# Patient Record
Sex: Male | Born: 1980 | Race: White | Hispanic: No | Marital: Single | Smoking: Current every day smoker
Health system: Southern US, Community
[De-identification: ages and names within clinical notes are randomized; demographics above are authoritative.]

## PROBLEM LIST (undated history)

## (undated) DIAGNOSIS — F209 Schizophrenia, unspecified: Secondary | ICD-10-CM

## (undated) DIAGNOSIS — J45909 Unspecified asthma, uncomplicated: Secondary | ICD-10-CM

## (undated) HISTORY — PX: APPENDECTOMY: SHX54

## (undated) HISTORY — DX: Schizophrenia, unspecified: F20.9

## (undated) HISTORY — PX: RHINOPLASTY: SUR1284

## (undated) HISTORY — DX: Unspecified asthma, uncomplicated: J45.909

---

## 2009-08-19 ENCOUNTER — Emergency Department: Payer: Self-pay | Admitting: Emergency Medicine

## 2009-11-10 ENCOUNTER — Emergency Department: Payer: Self-pay | Admitting: Unknown Physician Specialty

## 2009-12-14 ENCOUNTER — Inpatient Hospital Stay: Payer: Self-pay | Admitting: Psychiatry

## 2015-05-12 ENCOUNTER — Encounter: Payer: Self-pay | Admitting: Family Medicine

## 2015-05-12 ENCOUNTER — Ambulatory Visit (INDEPENDENT_AMBULATORY_CARE_PROVIDER_SITE_OTHER): Payer: Medicare Other | Admitting: Family Medicine

## 2015-05-12 VITALS — BP 134/72 | HR 134 | Temp 98.8°F | Resp 16 | Ht 71.0 in | Wt 239.0 lb

## 2015-05-12 DIAGNOSIS — M549 Dorsalgia, unspecified: Secondary | ICD-10-CM | POA: Diagnosis not present

## 2015-05-12 DIAGNOSIS — F209 Schizophrenia, unspecified: Secondary | ICD-10-CM | POA: Diagnosis not present

## 2015-05-12 DIAGNOSIS — G8929 Other chronic pain: Secondary | ICD-10-CM

## 2015-05-12 DIAGNOSIS — Z720 Tobacco use: Secondary | ICD-10-CM | POA: Diagnosis not present

## 2015-05-12 DIAGNOSIS — J4530 Mild persistent asthma, uncomplicated: Secondary | ICD-10-CM

## 2015-05-12 DIAGNOSIS — F17210 Nicotine dependence, cigarettes, uncomplicated: Secondary | ICD-10-CM

## 2015-05-12 MED ORDER — CYCLOBENZAPRINE HCL 5 MG PO TABS: 5.0000 mg | ORAL_TABLET | Freq: Three times a day (TID) | ORAL | Status: DC | PRN

## 2015-05-12 MED ORDER — BUDESONIDE 90 MCG/ACT IN AEPB: 2.0000 | INHALATION_SPRAY | Freq: Two times a day (BID) | RESPIRATORY_TRACT | Status: DC

## 2015-05-12 NOTE — Progress Notes (Signed)
Name: Brett Coffey   MRN: [id number]    DOB: 1980-11-24   Date:05/12/2015       Progress Note  Subjective  Chief Complaint  Chief Complaint  Patient presents with  . Medication Refill  . Asthma  . Back Pain    onset yesterday states he thinks he pulled a muscle    HPI  Brett Coffey is a 35 year old male who has seen me once 09/08/14 and returns today stating he has a diagnosis of Asthma and needs a refill on Budesodine 36mcg/inhaler. He states he still sees a mental health provider otherwise. He smells of smoke today and admits to heavy cigarette smoking use. As last time he says he hurt his back and is requesting pain medication.  Active Ambulatory Problems    Diagnosis Date Noted  . Schizophrenia (HCC) 05/12/2015  . Chronic back pain 05/12/2015  . Mild persistent asthma in adult without complication 05/12/2015  . Cigarette smoker 05/12/2015   Resolved Ambulatory Problems    Diagnosis Date Noted  . No Resolved Ambulatory Problems   Past Medical History  Diagnosis Date  . Asthma      Patient Active Problem List   Diagnosis Date Noted  . Schizophrenia (HCC) 05/12/2015    Social History  Substance Use Topics  . Smoking status: Current Every Day Smoker  . Smokeless tobacco: Never Used  . Alcohol Use: 0.0 oz/week    0 Standard drinks or equivalent per week     Current outpatient prescriptions:  .  Budesonide 90 MCG/ACT inhaler, Inhale 1 puff into the lungs 2 (two) times daily., Disp: , Rfl:  .  citalopram (CELEXA) 20 MG tablet, Take 1 tablet by mouth every morning., Disp: , Rfl: 0 .  LATUDA 40 MG TABS tablet, Take 1 tablet by mouth daily., Disp: , Rfl: 0  No past surgical history on file.  Family History  Problem Relation Age of Onset  . Diabetes Mother     No Known Allergies   Review of Systems  CONSTITUTIONAL: No significant weight changes, fever, chills, weakness or fatigue.  CARDIOVASCULAR: No chest pain, chest pressure or chest discomfort.  No palpitations or edema.  RESPIRATORY: No shortness of breath, cough or sputum.  NEUROLOGICAL: No headache, dizziness, syncope, paralysis, ataxia, numbness or tingling in the extremities. No memory changes. No change in bowel or bladder control.  MUSCULOSKELETAL: Yes joint pain. No muscle pain. HEMATOLOGIC: No anemia, bleeding or bruising.  LYMPHATICS: No enlarged lymph nodes.  PSYCHIATRIC: No change in mood. No change in sleep pattern.  ENDOCRINOLOGIC: No reports of sweating, cold or heat intolerance. No polyuria or polydipsia.     Objective  BP 134/72 mmHg  Pulse 134  Temp(Src) 98.8 F (37.1 C) (Oral)  Resp 16  Ht  (1.803 m)  Wt 239 lb (108.41 kg)  BMI 33.35 kg/m2  SpO2 94% Body mass index is 33.35 kg/(m^2).  Physical Exam  Constitutional: Patient is obese and well-nourished. In no distress. Smells of cigarette smoke, odd behavior, sitting in exam room with headphones in his ears and audible music which he does not take out when I speak to him during his exam. HEENT:  - Head: Normocephalic and atraumatic.  - Ears: Bilateral TMs gray, no erythema or effusion - Nose: Nasal mucosa moist - Mouth/Throat: Oropharynx is clear and moist. No tonsillar hypertrophy or erythema. No post nasal drainage.  - Eyes: Conjunctivae clear, EOM movements normal. PERRLA. No scleral icterus.  Neck: Normal range of motion. Neck  supple. No JVD present. No thyromegaly present.  Cardiovascular: Normal rate, regular rhythm and normal heart sounds.  No murmur heard.  Pulmonary/Chest: Effort normal and breath sounds decreased tidal volume. No respiratory distress. Musculoskeletal: Normal range of motion bilateral UE and LE, no joint effusions. Peripheral vascular: Bilateral LE no edema. Neurological: CN II-XII grossly intact with no focal deficits. Alert and oriented to person, place, and time. Coordination, balance, strength, speech and gait are normal.  Skin: Skin is warm and dry. No rash noted.  No erythema.  Psychiatric: Patient has a stable mood and affect. Flat affect.     Assessment & Plan  1. Mild persistent asthma in adult without complication New diagnosis to me as he was not very informative about his history at our last visit. Restarted him on his daily inhaler but he needs to quit smoking.  - Budesonide 90 MCG/ACT inhaler; Inhale 2 puffs into the lungs 2 (two) times daily.  Dispense: 1 Inhaler; Refill: 1  2. Chronic back pain Narcotic drug seeking behavior suspected. I declined to prescribe any controled substance, may use muscle relaxer as needed.  - cyclobenzaprine (FLEXERIL) 5 MG tablet; Take 1 tablet (5 mg total) by mouth 3 (three) times daily as needed for muscle spasms.  Dispense: 30 tablet; Refill: 0  3. Schizophrenia, unspecified type (HCC) I have not received any notes from a psychiatrist and not sure if he is getting regular mental health care along with management of his Celexa and Latuda as I do not fill those medications.   4. Cigarette smoker The patient has been counseled on smoking cessation benefits, goals, strategies and available over the counter and prescription medications that may help them in their efforts.  Options discussed include Nicoderm patches, Wellbutrin and Chantix.  The patient voices understanding their increased risk of cardiovascular and pulmonary diseases with continued use of tobacco products.

## 2015-05-25 ENCOUNTER — Other Ambulatory Visit: Payer: Self-pay | Admitting: Family Medicine

## 2015-06-14 ENCOUNTER — Other Ambulatory Visit: Payer: Self-pay | Admitting: Family Medicine

## 2015-08-20 ENCOUNTER — Telehealth: Payer: Self-pay | Admitting: Family Medicine

## 2015-08-20 NOTE — Telephone Encounter (Signed)
Patient's insurance is not going to pay for pulmicort any more; appt please in the next few weeks to discuss asthma, do spirometry, and discuss medicine options Thank you

## 2015-08-20 NOTE — Telephone Encounter (Signed)
Appointment scheduled.

## 2015-08-27 ENCOUNTER — Other Ambulatory Visit: Payer: Self-pay

## 2015-08-28 NOTE — Telephone Encounter (Signed)
Last note reviewed; I'd like to see him in the office if he is still having the need for muscle relaxant, he might benefit from physical therapy or further work-up Thank you

## 2015-08-30 NOTE — Telephone Encounter (Signed)
Left message to return call 

## 2015-08-31 ENCOUNTER — Ambulatory Visit: Payer: Medicare Other | Admitting: Family Medicine

## 2016-01-13 ENCOUNTER — Emergency Department
Admission: EM | Admit: 2016-01-13 | Discharge: 2016-01-14 | Disposition: A | Discharge status: Other Acute Inpatient Hospital | Payer: Medicare Other | Attending: Emergency Medicine | Admitting: Emergency Medicine

## 2016-01-13 ENCOUNTER — Encounter: Payer: Self-pay | Admitting: *Deleted

## 2016-01-13 DIAGNOSIS — Z79899 Other long term (current) drug therapy: Secondary | ICD-10-CM | POA: Insufficient documentation

## 2016-01-13 DIAGNOSIS — J4530 Mild persistent asthma, uncomplicated: Secondary | ICD-10-CM | POA: Insufficient documentation

## 2016-01-13 DIAGNOSIS — R45850 Homicidal ideations: Secondary | ICD-10-CM

## 2016-01-13 DIAGNOSIS — F209 Schizophrenia, unspecified: Secondary | ICD-10-CM | POA: Insufficient documentation

## 2016-01-13 DIAGNOSIS — F1290 Cannabis use, unspecified, uncomplicated: Secondary | ICD-10-CM | POA: Insufficient documentation

## 2016-01-13 DIAGNOSIS — F17210 Nicotine dependence, cigarettes, uncomplicated: Secondary | ICD-10-CM | POA: Insufficient documentation

## 2016-01-13 LAB — URINE DRUG SCREEN, QUALITATIVE (ARMC ONLY)
AMPHETAMINES, UR SCREEN: NOT DETECTED
BARBITURATES, UR SCREEN: NOT DETECTED
BENZODIAZEPINE, UR SCRN: NOT DETECTED
Cannabinoid 50 Ng, Ur ~~LOC~~: POSITIVE — AB
Cocaine Metabolite,Ur ~~LOC~~: NOT DETECTED
MDMA (Ecstasy)Ur Screen: NOT DETECTED
METHADONE SCREEN, URINE: NOT DETECTED
Opiate, Ur Screen: NOT DETECTED
Phencyclidine (PCP) Ur S: NOT DETECTED
TRICYCLIC, UR SCREEN: NOT DETECTED

## 2016-01-13 LAB — CBC
HCT: 50.8 % (ref 40.0–52.0)
Hemoglobin: 17.3 g/dL (ref 13.0–18.0)
MCH: 30.7 pg (ref 26.0–34.0)
MCHC: 34.1 g/dL (ref 32.0–36.0)
MCV: 90.2 fL (ref 80.0–100.0)
Platelets: 335 10*3/uL (ref 150–440)
RBC: 5.63 MIL/uL (ref 4.40–5.90)
RDW: 12.8 % (ref 11.5–14.5)
WBC: 13.9 10*3/uL — AB (ref 3.8–10.6)

## 2016-01-13 LAB — ACETAMINOPHEN LEVEL

## 2016-01-13 LAB — COMPREHENSIVE METABOLIC PANEL
ALBUMIN: 4.7 g/dL (ref 3.5–5.0)
ALK PHOS: 72 U/L (ref 38–126)
ALT: 13 U/L — ABNORMAL LOW (ref 17–63)
ANION GAP: 11 (ref 5–15)
AST: 21 U/L (ref 15–41)
BUN: 7 mg/dL (ref 6–20)
CALCIUM: 9.4 mg/dL (ref 8.9–10.3)
CO2: 22 mmol/L (ref 22–32)
Chloride: 108 mmol/L (ref 101–111)
Creatinine, Ser: 1.26 mg/dL — ABNORMAL HIGH (ref 0.61–1.24)
GFR calc non Af Amer: >60 mL/min (ref >60)
Glucose, Bld: 127 mg/dL — ABNORMAL HIGH (ref 65–99)
POTASSIUM: 3.5 mmol/L (ref 3.5–5.1)
SODIUM: 141 mmol/L (ref 135–145)
TOTAL PROTEIN: 8.5 g/dL — AB (ref 6.5–8.1)
Total Bilirubin: 0.5 mg/dL (ref 0.3–1.2)

## 2016-01-13 LAB — SALICYLATE LEVEL

## 2016-01-13 LAB — ETHANOL: Alcohol, Ethyl (B): <5 mg/dL (ref <5)

## 2016-01-13 MED ORDER — DIAZEPAM 5 MG PO TABS
10.0000 mg | ORAL_TABLET | Freq: Once | ORAL | Status: AC
  Administered 2016-01-13: 10 mg via ORAL
  Filled 2016-01-13: qty 2

## 2016-01-13 NOTE — ED Notes (Signed)
BEHAVIORAL HEALTH ROUNDING  Patient sleeping: Yes Patient alert and oriented: Sleeping Behavior appropriate: Yes. ; If no, describe:  Nutrition and fluids offered: No, sleeping  Toileting and hygiene offered: No, sleeping  Sitter present: q15 minute observations and security monitoring  Law enforcement present: Yes ODS 
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
## 2016-01-13 NOTE — ED Notes (Signed)
Pt. Transferred to [facility] from ED to room 1. Report to include Situation, Background, Assessment and Recommendations from Sarah RN. Pt. Oriented to unit including Q15 minute rounds as well as the security cameras for their protection. Patient is alert and oriented, warm and dry in no acute distress. Patient denies SI, HI, and AVH. Pt. Encouraged to let me know if needs arise.
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
## 2016-01-13 NOTE — ED Notes (Signed)
Pt given meal tray and sprite at this time 
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
## 2016-01-14 ENCOUNTER — Inpatient Hospital Stay
Admission: RE | Admit: 2016-01-14 | Discharge: 2016-01-18 | DRG: 885 | Disposition: A | Discharge status: Home / Self Care | Payer: Medicare Other | Source: Intra-hospital | Attending: Psychiatry | Admitting: Psychiatry

## 2016-01-14 DIAGNOSIS — J45909 Unspecified asthma, uncomplicated: Secondary | ICD-10-CM

## 2016-01-14 DIAGNOSIS — F1420 Cocaine dependence, uncomplicated: Secondary | ICD-10-CM

## 2016-01-14 DIAGNOSIS — F203 Undifferentiated schizophrenia: Secondary | ICD-10-CM

## 2016-01-14 DIAGNOSIS — F17210 Nicotine dependence, cigarettes, uncomplicated: Secondary | ICD-10-CM | POA: Diagnosis present

## 2016-01-14 DIAGNOSIS — R45850 Homicidal ideations: Secondary | ICD-10-CM | POA: Diagnosis present

## 2016-01-14 DIAGNOSIS — Z79899 Other long term (current) drug therapy: Secondary | ICD-10-CM | POA: Diagnosis not present

## 2016-01-14 DIAGNOSIS — F17200 Nicotine dependence, unspecified, uncomplicated: Secondary | ICD-10-CM

## 2016-01-14 DIAGNOSIS — F1220 Cannabis dependence, uncomplicated: Secondary | ICD-10-CM

## 2016-01-14 DIAGNOSIS — J4530 Mild persistent asthma, uncomplicated: Secondary | ICD-10-CM | POA: Diagnosis present

## 2016-01-14 MED ORDER — MAGNESIUM HYDROXIDE 400 MG/5ML PO SUSP: 30.0000 mL | Freq: Every day | ORAL | Status: DC | PRN

## 2016-01-14 MED ORDER — NICOTINE 21 MG/24HR TD PT24
21.0000 mg | MEDICATED_PATCH | Freq: Once | TRANSDERMAL | Status: DC
  Administered 2016-01-14: 21 mg via TRANSDERMAL
  Filled 2016-01-14: qty 1

## 2016-01-14 MED ORDER — LURASIDONE HCL 80 MG PO TABS: 80.0000 mg | ORAL_TABLET | Freq: Every day | ORAL | Status: DC

## 2016-01-14 MED ORDER — CITALOPRAM HYDROBROMIDE 20 MG PO TABS
20.0000 mg | ORAL_TABLET | Freq: Every day | ORAL | Status: DC
  Administered 2016-01-14: 20 mg via ORAL
  Filled 2016-01-14: qty 1

## 2016-01-14 MED ORDER — ACETAMINOPHEN 325 MG PO TABS: 650.0000 mg | ORAL_TABLET | Freq: Four times a day (QID) | ORAL | Status: DC | PRN

## 2016-01-14 MED ORDER — NICOTINE 21 MG/24HR TD PT24
21.0000 mg | MEDICATED_PATCH | Freq: Every day | TRANSDERMAL | Status: DC
Start: 2016-01-15 — End: 2016-01-18
  Administered 2016-01-15 – 2016-01-18 (×4): 21 mg via TRANSDERMAL
  Filled 2016-01-14 (×4): qty 1

## 2016-01-14 MED ORDER — ALUM & MAG HYDROXIDE-SIMETH 200-200-20 MG/5ML PO SUSP: 30.0000 mL | ORAL | Status: DC | PRN

## 2016-01-14 MED ORDER — CITALOPRAM HYDROBROMIDE 20 MG PO TABS
20.0000 mg | ORAL_TABLET | Freq: Every day | ORAL | Status: DC
  Administered 2016-01-15 – 2016-01-18 (×4): 20 mg via ORAL
  Filled 2016-01-14 (×4): qty 1

## 2016-01-14 MED ORDER — LURASIDONE HCL 80 MG PO TABS
80.0000 mg | ORAL_TABLET | Freq: Every day | ORAL | Status: DC
Start: 2016-01-14 — End: 2016-01-14
  Administered 2016-01-14: 80 mg via ORAL
  Filled 2016-01-14: qty 1

## 2016-01-14 MED ORDER — HYDROXYZINE HCL 25 MG PO TABS
25.0000 mg | ORAL_TABLET | Freq: Three times a day (TID) | ORAL | Status: DC | PRN
  Administered 2016-01-17: 25 mg via ORAL
  Filled 2016-01-14: qty 1

## 2016-01-14 NOTE — ED Notes (Signed)
Patient noted sleeping in room. No complaints, stable, in no acute distress. Q15 minute rounds and monitoring via Security Cameras to continue.  

## 2016-01-14 NOTE — ED Notes (Signed)
Patient noted in room. No complaints, stable, in no acute distress. Q15 minute rounds and monitoring via Security Cameras to continue.  

## 2016-01-14 NOTE — Progress Notes (Signed)
Patient ID: Brett Coffey, male   DOB: 07/16/1980, 35 y.o.   MRN: [id number]  [facility] Pt is alert and oriented x4. Pt reports "I was having homicidal thoughts towards my roommate." Pt denies a plan. Denies HI at this time. Denies SI/AVH. Pt is calm and cooperative with assessment. He rates anxiety and depression 1/10. Pt reports that he has been compliant with his medications. Reports that while here he would like to work on "anger management." Skin assessment performed and no contraband found. Pt has tattoo on right upper arm. Pt oriented to unit. q15 minute safety checks maintained. Pt remains free from harm. Will continue to monitor.

## 2016-01-14 NOTE — ED Notes (Signed)
Pt reports smoking 2PPD. Nicotine patch ordered.

## 2016-01-14 NOTE — ED Notes (Signed)
BEHAVIORAL HEALTH ROUNDING  Patient sleeping: No.  Patient alert and oriented: yes  Behavior appropriate: Yes. ; If no, describe:  Nutrition and fluids offered: Yes  Toileting and hygiene offered: Yes  Sitter present: not applicable, Q 15 min safety rounds and observation via security camera. Law enforcement present: Yes ODS  

## 2016-01-14 NOTE — ED Notes (Signed)
Patient is voluntary and will be admitted to [facility] Behavior[facility]. 

## 2016-01-14 NOTE — ED Notes (Signed)
ENVIRONMENTAL ASSESSMENT  Potentially harmful objects out of patient reach: Yes.  Personal belongings secured: Yes.  Patient dressed in hospital provided attire only: Yes.  Plastic bags out of patient reach: Yes.  Patient care equipment (cords, cables, call bells, lines, and drains) shortened, removed, or accounted for: Yes.  Equipment and supplies removed from bottom of stretcher: Yes.  Potentially toxic materials out of patient reach: Yes.  Sharps container removed or out of patient reach: Yes.   BEHAVIORAL HEALTH ROUNDING  Patient sleeping: No.  Patient alert and oriented: yes  Behavior appropriate: Yes. ; If no, describe:  Nutrition and fluids offered: Yes  Toileting and hygiene offered: Yes  Sitter present: not applicable, Q 15 min safety rounds and observation via security camera. Law enforcement present: Yes ODS  ED BHU PLACEMENT JUSTIFICATION  Is the patient under IVC or is there intent for IVC: No Is the patient medically cleared: Yes.  Is there vacancy in the ED BHU: Yes.  Is the population mix appropriate for patient: Yes.  Is the patient awaiting placement in inpatient or outpatient setting: Yes. Inpatient room pending.  Has the patient had a psychiatric consult: Yes.  Survey of unit performed for contraband, proper placement and condition of furniture, tampering with fixtures in bathroom, shower, and each patient room: Yes. ; Findings: All clear  APPEARANCE/BEHAVIOR  calm, cooperative and adequate rapport can be established  NEURO ASSESSMENT  Orientation: time, place and person  Hallucinations: No.None noted (Hallucinations)  Speech: Normal  Gait: normal  RESPIRATORY ASSESSMENT  WNL  CARDIOVASCULAR ASSESSMENT  WNL  GASTROINTESTINAL ASSESSMENT  WNL  EXTREMITIES  WNL  PLAN OF CARE  Provide calm/safe environment. Vital signs assessed twice daily. ED BHU Assessment once each 12-hour shift. Collaborate with TTS daily or as condition indicates. Assure the ED provider  has rounded once each shift. Provide and encourage hygiene. Provide redirection as needed. Assess for escalating behavior; address immediately and inform ED provider.  Assess family dynamic and appropriateness for visitation as needed: Yes. ; If necessary, describe findings:  Educate the patient/family about BHU procedures/visitation: Yes. ; If necessary, describe findings: Pt is calm and cooperative at this time. Pt understanding and accepting of unit procedures/rules. Will continue to monitor with Q 15 min safety rounds and observation via security camera. Pt contracts for safety with this RN at this time.

## 2016-01-14 NOTE — Tx Team (Signed)
Initial Treatment Plan 01/14/2016 11:09 PM Brett RodChristopher Coiro ZOX:[id number]    PATIENT STRESSORS: Substance abuse Other: Conflict with roommate   PATIENT STRENGTHS: Ability for insight Capable of independent living General fund of knowledge Physical Health   PATIENT IDENTIFIED PROBLEMS: "I was having homicidal thoughts towards my roommate"    Aggression "anger management"  Schizophrenia               DISCHARGE CRITERIA:  Improved stabilization in mood, thinking, and/or behavior Motivation to continue treatment in a less acute level of care Need for constant or close observation no longer present Verbal commitment to aftercare and medication compliance  PRELIMINARY DISCHARGE PLAN: Outpatient therapy Placement in alternative living arrangements  PATIENT/FAMILY INVOLVEMENT: This treatment plan has been presented to and reviewed with the patient, Brett Coffey, and/or family member.  The patient and family have been given the opportunity to ask questions and make suggestions.  Cristela FeltMichelle P Wiletta Bermingham, RN 01/14/2016, 11:09 PM
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
## 2016-01-15 DIAGNOSIS — F203 Undifferentiated schizophrenia: Principal | ICD-10-CM

## 2016-01-15 LAB — LIPID PANEL
Cholesterol: 166 mg/dL (ref 0–200)
HDL: 36 mg/dL — ABNORMAL LOW (ref >40)
LDL CALC: 98 mg/dL (ref 0–99)
TRIGLYCERIDES: 160 mg/dL — AB (ref <150)
Total CHOL/HDL Ratio: 4.6 RATIO
VLDL: 32 mg/dL (ref 0–40)

## 2016-01-15 LAB — TSH: TSH: 0.679 u[IU]/mL (ref 0.350–4.500)

## 2016-01-15 MED ORDER — LURASIDONE HCL 40 MG PO TABS
60.0000 mg | ORAL_TABLET | Freq: Every day | ORAL | Status: DC
  Filled 2016-01-15: qty 1

## 2016-01-15 MED ORDER — LURASIDONE HCL 40 MG PO TABS
60.0000 mg | ORAL_TABLET | Freq: Every day | ORAL | Status: DC
  Administered 2016-01-15 – 2016-01-17 (×3): 60 mg via ORAL
  Filled 2016-01-15 (×3): qty 2

## 2016-01-15 NOTE — H&P (Signed)
Psychiatric Admission Assessment Adult  Patient Identification: Brett Coffey MRN:  [id number] Date of Evaluation:  01/15/2016 Chief Complaint:  Schizophrenia Principal Diagnosis: Schizophrenia, undifferentiated (HCC) Diagnosis:   Patient Active Problem List   Diagnosis Date Noted  . Cannabis abuse [F12.10] 01/14/2016  . Schizophrenia, undifferentiated (HCC) [F20.3] 01/14/2016  . Schizophrenia (HCC) [F20.9] 05/12/2015  . Chronic back pain [M54.9, G89.29] 05/12/2015  . Mild persistent asthma in adult without complication [J45.30] 05/12/2015  . Cigarette smoker [Z72.0] 05/12/2015   History of Present Illness: 35 year old man admitted to the hospital because of thoughts of hurting or killing his roommate which appeared to be somewhat based on paranoia. History of aggression in the past. Diagnosis of schizophrenia. Also chronic abuse of marijuana. Associated Signs/Symptoms: Depression Symptoms:  anhedonia, (Hypo) Manic Symptoms:  Distractibility, Anxiety Symptoms:  None identified Psychotic Symptoms:  Delusions, Paranoia, PTSD Symptoms: Negative Total Time spent with patient: 45 minutes  Past Psychiatric History: Patient has a long history of schizophrenia. Has been on antipsychotic medication and followed up as an outpatient. He says he has been compliant with his medicine. Positive past history of aggression and violence from delusions. No history of suicide attempt. Chronic substance abuse. Has outpatient treatment in place.  Is the patient at risk to self? No.  Has the patient been a risk to self in the past 6 months? No.  Has the patient been a risk to self within the distant past? No.  Is the patient a risk to others? Yes.    Has the patient been a risk to others in the past 6 months? Yes.    Has the patient been a risk to others within the distant past? Yes.     Prior Inpatient Therapy:  patient has had prior hospitalizations Prior Outpatient Therapy:  currently receiving  outpatient treatment receives chronic mental health follow-up  Alcohol Screening: 1. How often do you have a drink containing alcohol?: 2 to 4 times a month 2. How many drinks containing alcohol do you have on a typical day when you are drinking?: 7, 8, or 9 3. How often do you have six or more drinks on one occasion?: Weekly Preliminary Score: 6 4. How often during the last year have you found that you were not able to stop drinking once you had started?: Never 5. How often during the last year have you failed to do what was normally expected from you becasue of drinking?: Never 6. How often during the last year have you needed a first drink in the morning to get yourself going after a heavy drinking session?: Never 7. How often during the last year have you had a feeling of guilt of remorse after drinking?: Never 8. How often during the last year have you been unable to remember what happened the night before because you had been drinking?: Never 9. Have you or someone else been injured as a result of your drinking?: No 10. Has a relative or friend or a doctor or another health worker been concerned about your drinking or suggested you cut down?: No Alcohol Use Disorder Identification Test Final Score (AUDIT): 8 Brief Intervention: Yes Substance Abuse History in the last 12 months:  Yes.   Consequences of Substance Abuse: Negative Previous Psychotropic Medications: Yes  Psychological Evaluations: Yes  Past Medical History:  Past Medical History:  Diagnosis Date  . Asthma   . Schizophrenia [facility])     Past Surgical History:  Procedure Laterality Date  . APPENDECTOMY    .  RHINOPLASTY     Family History:  Family History  Problem Relation Age of Onset  . Diabetes Mother    Family Psychiatric  History: Patient denies being aware of any family history of mental illness Tobacco Screening: Have you used any form of tobacco in the last 30 days? (Cigarettes, Smokeless Tobacco, Cigars,  and/or Pipes): Yes Tobacco use, Select all that apply: 5 or more cigarettes per day Are you interested in Tobacco Cessation Medications?: Yes, will notify MD for an order Counseled patient on smoking cessation including recognizing danger situations, developing coping skills and basic information about quitting provided: Refused/Declined practical counseling Social History:  History  Alcohol Use  . 0.0 oz/week    Comment: "once a week"     History  Drug Use  . Types: Marijuana    Comment: "every day"    Additional Social History: Marital status: Single Are you sexually active?: Yes What is your sexual orientation?: heterosexual Has your sexual activity been affected by drugs, alcohol, medication, or emotional stress?: n/a Does patient have children?: Yes How many children?: 1 How is patient's relationship with their children?: 1 son, Pt states he does not have a relationship with son due to him living in [place]    Pain Medications: denies Prescriptions: see PTA meds Over the Counter: denies History of alcohol / drug use?: Yes                    Allergies:  No Known Allergies Lab Results:  Results for orders placed or performed during the hospital encounter of 01/14/16 (from the past 48 hour(s))  Lipid panel     Status: Abnormal   Collection Time: 01/15/16  6:27 AM  Result Value Ref Range   Cholesterol 166 0 - 200 mg/dL   Triglycerides 119160 (H) <150 mg/dL   HDL 36 (L) >14>40 mg/dL   Total CHOL/HDL Ratio 4.6 RATIO   VLDL 32 0 - 40 mg/dL   LDL Cholesterol 98 0 - 99 mg/dL    Comment:        Total Cholesterol/HDL:CHD Risk Coronary Heart Disease Risk Table                     Men   Women  1/2 Average Risk   3.4   3.3  Average Risk       5.0   4.4  2 X Average Risk   9.6   7.1  3 X Average Risk  23.4   11.0        Use the calculated Patient Ratio above and the CHD Risk Table to determine the patient's CHD Risk.        ATP III CLASSIFICATION (LDL):  <100      mg/dL   Optimal  [id number]  mg/dL   Near or Above                    Optimal  130-159  mg/dL   Borderline  [id number]  mg/dL   High  >578>190     mg/dL   Very High   TSH     Status: None   Collection Time: 01/15/16  6:27 AM  Result Value Ref Range   TSH 0.679 0.350 - 4.500 uIU/mL    Blood Alcohol level:  Lab Results  Component Value Date   ETH <5 01/13/2016    Metabolic Disorder Labs:  No results found for: HGBA1C, MPG No results found for: PROLACTIN Lab Results  Component Value Date   CHOL 166 01/15/2016   TRIG 160 (H) 01/15/2016   HDL 36 (L) 01/15/2016   CHOLHDL 4.6 01/15/2016   VLDL 32 01/15/2016   LDLCALC 98 01/15/2016    Current Medications: Current Facility-Administered Medications  Medication Dose Route Frequency Provider Last Rate Last Dose  . acetaminophen (TYLENOL) tablet 650 mg  650 mg Oral Q6H PRN Audery Amel, MD      . alum & mag hydroxide-simeth (MAALOX/MYLANTA) 200-200-20 MG/5ML suspension 30 mL  30 mL Oral Q4H PRN Audery Amel, MD      . citalopram (CELEXA) tablet 20 mg  20 mg Oral Daily Audery Amel, MD   20 mg at 01/15/16 0900  . hydrOXYzine (ATARAX/VISTARIL) tablet 25 mg  25 mg Oral TID PRN Audery Amel, MD      . lurasidone (LATUDA) tablet 60 mg  60 mg Oral Q supper Audery Amel, MD      . magnesium hydroxide (MILK OF MAGNESIA) suspension 30 mL  30 mL Oral Daily PRN Audery Amel, MD      . nicotine (NICODERM CQ - dosed in mg/24 hours) patch 21 mg  21 mg Transdermal Daily Jimmy Footman, MD   21 mg at 01/15/16 0902   PTA Medications: Prescriptions Prior to Admission  Medication Sig Dispense Refill Last Dose  . citalopram (CELEXA) 20 MG tablet Take 1 tablet by mouth every morning.  0 01/14/2016 at Unknown time  . LATUDA 40 MG TABS tablet Take 1 tablet by mouth daily.  0 01/14/2016 at Unknown time    Musculoskeletal: Strength & Muscle Tone: within normal limits Gait & Station: normal Patient leans: N/A  Psychiatric Specialty  Exam: Physical Exam  Constitutional: He appears well-developed and well-nourished.  HENT:  Head: Normocephalic and atraumatic.  Eyes: Conjunctivae are normal. Pupils are equal, round, and reactive to light.  Neck: Normal range of motion.  Cardiovascular: Regular rhythm and normal heart sounds.   Respiratory: Effort normal. No respiratory distress.  GI: Soft.  Musculoskeletal: Normal range of motion.  Neurological: He is alert.  Skin: Skin is warm and dry.  Psychiatric: His affect is blunt. His speech is delayed. He is slowed. Thought content is not paranoid. Cognition and memory are normal. He expresses impulsivity. He expresses no homicidal and no suicidal ideation.    Review of Systems  Constitutional: Negative.   HENT: Negative.   Eyes: Negative.   Respiratory: Negative.   Cardiovascular: Negative.   Gastrointestinal: Negative.   Musculoskeletal: Negative.   Skin: Negative.   Neurological: Negative.   Psychiatric/Behavioral: Positive for substance abuse. Negative for depression, hallucinations, memory loss and suicidal ideas. The patient is not nervous/anxious and does not have insomnia.     Blood pressure 127/85, pulse 66, temperature 98.5 F (36.9 C), temperature source Oral, resp. rate 18, height 5\' 9"  (1.753 m), weight 98.4 kg (217 lb).Body mass index is 32.05 kg/m.  General Appearance: Fairly Groomed  Eye Contact:  Fair  Speech:  Normal Rate  Volume:  Decreased  Mood:  Euthymic  Affect:  Congruent  Thought Process:  Goal Directed  Orientation:  Full (Time, Place, and Person)  Thought Content:  Logical  Suicidal Thoughts:  No  Homicidal Thoughts:  No  Memory:  Immediate;   Good Recent;   Fair Remote;   Fair  Judgement:  Fair  Insight:  Fair  Psychomotor Activity:  Normal  Concentration:  Concentration: Fair  Recall:  [employer] of Knowledge:  Fair  Language:  Fair  Akathisia:  No  Handed:  Right  AIMS (if indicated):     Assets:  Communication  Skills Desire for Improvement Financial Resources/Insurance Housing Resilience  ADL's:  Intact  Cognition:  Impaired,  Mild  Sleep:  Number of Hours: 6.25    Treatment Plan Summary: Daily contact with patient to assess and evaluate symptoms and progress in treatment, Medication management and Plan Patient will be continued on Latuda as treatment for psychosis. He felt the 80 mg was slightly too high. He feels like it "makes me feel funny". I suggested 60 mg and he is agreeable. He has when necessary medicines if needed for sleep and anxiety. Continue Celexa. Encourage patient to engage in groups and interaction with others. Continued daily assessment. Today he reports he is not having active homicidal thoughts and not having voices.  Observation Level/Precautions:  15 minute checks  Laboratory:  HbAIC  Psychotherapy:    Medications:    Consultations:    Discharge Concerns:    Estimated LOS:  Other:     Physician Treatment Plan for Primary Diagnosis: Schizophrenia, undifferentiated (HCC) Long Term Goal(s): Improvement in symptoms so as ready for discharge  Short Term Goals: Ability to verbalize feelings will improve, Ability to disclose and discuss suicidal ideas, Ability to demonstrate self-control will improve and Compliance with prescribed medications will improve  Physician Treatment Plan for Secondary Diagnosis: Principal Problem:   Schizophrenia, undifferentiated (HCC) Active Problems:   Cannabis abuse  Long Term Goal(s): Improvement in symptoms so as ready for discharge  Short Term Goals: Ability to identify triggers associated with substance abuse/mental health issues will improve  I certify that inpatient services furnished can reasonably be expected to improve the patient's condition.    Mordecai Rasmussen, MD 9/30/201712:54 PM

## 2016-01-15 NOTE — [facility] Group Notes (Signed)
[facility] LCSW Group Therapy  01/15/2016 2:06 PM  Type of Therapy:  Group Therapy  Participation Level:  Active  Participation Quality:  Appropriate  Affect:  Appropriate  Cognitive:  Alert  Insight:  Improving  Engagement in Therapy:  Engaged  Modes of Intervention:  Activity, Discussion, Education and Support  Summary of Progress/Problems:Balance in life: Patients will discuss the concept of balance and how it looks and feels to be unbalanced. Pt will identify areas in their life that is unbalanced and ways to become more balanced. Patient states he needs to manage his money better and improve taking his medications regularly.   Kannon Granderson G. Garnette CzechSampson MSW, LCSWA 01/15/2016, 2:08 PM

## 2016-01-15 NOTE — Progress Notes (Signed)
Patient ID: Brett RodChristopher Rufener, male   DOB: 01/11/1981, 35 y.o.   MRN: [id number] Full labs reviewed. Lipid panel shows slight abnormality with lower than expected HDL and some elevated triglycerides. EKG shows a QT corrected of 466. I will not discontinue his crucial antipsychotic medicines but we will keep that in mind going forward with medicines. No other change to treatment.

## 2016-01-15 NOTE — [facility] Group Notes (Signed)
[facility] Group Notes:  (Nursing/MHT/Case Management/Adjunct)  Date:  01/15/2016  Time:  9:36 PM  Type of Therapy:  Psychoeducational Skills  Participation Level:  Active  Participation Quality:  Appropriate  Affect:  Appropriate  Cognitive:  Alert  Insight:  Good  Engagement in Group:  Engaged  Modes of Intervention:  Activity  Summary of Progress/Problems:  Brett Coffey 01/15/2016, 9:36 PM

## 2016-01-15 NOTE — [facility] Suicide Risk Assessment (Signed)
[facility] INPATIENT:  Family/Significant Other Suicide Prevention Education  Suicide Prevention Education:  Patient Refusal for Family/Significant Other Suicide Prevention Education: The patient Brett Coffey has refused to provide written consent for family/significant other to be provided Family/Significant Other Suicide Prevention Education during admission and/or prior to discharge.  Physician notified.  Aspynn Clover G. Garnette CzechSampson MSW, LCSWA 01/15/2016, 10:52 AM

## 2016-01-15 NOTE — Progress Notes (Signed)
Pt has been pleasant and cooperative. Pt has attended all unit activities. Pt denies SI and A/V hallucinations. Pt's mood and affect has been depressed. 

## 2016-01-15 NOTE — [facility] Suicide Risk Assessment (Signed)
[facility]   Nursing information obtained from:  Patient, Review of record Demographic factors:  Male, Caucasian, Unemployed Current Mental Status:  Thoughts of violence towards others Loss Factors:  NA Historical Factors:  NA Risk Reduction Factors:  Positive coping skills or problem solving skills, Positive therapeutic relationship  Total Time spent with patient: 45 minutes Principal Problem: <principal problem not specified> Diagnosis:   Patient Active Problem List   Diagnosis Date Noted  . Cannabis abuse [F12.10] 01/14/2016  . Schizophrenia, undifferentiated (HCC) [F20.3] 01/14/2016  . Schizophrenia (HCC) [F20.9] 05/12/2015  . Chronic back pain [M54.9, G89.29] 05/12/2015  . Mild persistent asthma in adult without complication [J45.30] 05/12/2015  . Cigarette smoker [Z72.0] 05/12/2015   Subjective Data: "I am feeling a lot better but that medicine made me a little weird feeling"  Continued Clinical Symptoms:  Alcohol Use Disorder Identification Test Final Score (AUDIT): 8 The "Alcohol Use Disorders Identification Test", Guidelines for Use in Primary Care, Second Edition.  World Science writerHealth Organization [facility](WHO). Score between 0-7:  no or low risk or alcohol related problems. Score between 8-15:  moderate risk of alcohol related problems. Score between 16-19:  high risk of alcohol related problems. Score 20 or above:  warrants further diagnostic evaluation for alcohol dependence and treatment.   CLINICAL FACTORS:   Alcohol/Substance Abuse/Dependencies Schizophrenia:   Paranoid or undifferentiated type   Musculoskeletal: Strength & Muscle Tone: within normal limits Gait & Station: normal Patient leans: N/A  Psychiatric Specialty Exam: Physical Exam  ROS  Blood pressure 127/85, pulse 66, temperature 98.5 F (36.9 C), temperature source Oral, resp. rate 18, height 5\' 9"  (1.753 m), weight 98.4 kg (217 lb).Body mass index is 32.05 kg/m.  General  Appearance: Casual  Eye Contact:  Fair  Speech:  Clear and Coherent  Volume:  Decreased  Mood:  Euthymic  Affect:  Congruent  Thought Process:  Goal Directed  Orientation:  Full (Time, Place, and Person)  Thought Content:  Logical  Suicidal Thoughts:  No  Homicidal Thoughts:  No  Memory:  Immediate;   Fair Recent;   Fair Remote;   Fair  Judgement:  Fair  Insight:  Fair  Psychomotor Activity:  Psychomotor Retardation  Concentration:  Concentration: Fair  Recall:  [employer]  Fund of Knowledge:  Fair  Language:  Fair  Akathisia:  No  Handed:  Right  AIMS (if indicated):     Assets:  Communication Skills Desire for Improvement Financial Resources/Insurance Housing Physical Health  ADL's:  Intact  Cognition:  Impaired,  Mild  Sleep:  Number of Hours: 6.25      COGNITIVE FEATURES THAT CONTRIBUTE TO RISK:  Polarized thinking    SUICIDE RISK:   Minimal: No identifiable suicidal ideation.  Patients presenting with no risk factors but with morbid ruminations; may be classified as minimal risk based on the severity of the depressive symptoms   PLAN OF CARE: Patient was admitted because of threatening and homicidal thinking. Does not have a past history of suicide. Diagnosis does however increase risk somewhat above baseline. Patient remains on 15 minute checks and is having daily reevaluation wall medicines are adjusted.  I certify that inpatient services furnished can reasonably be expected to improve the patient's condition.  Mordecai RasmussenJohn Mai Longnecker, MD 01/15/2016, 12:49 PM
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
## 2016-01-16 LAB — HEMOGLOBIN A1C
Hgb A1c MFr Bld: 5.2 % (ref 4.8–5.6)
Mean Plasma Glucose: 103 mg/dL

## 2016-01-16 NOTE — Progress Notes (Signed)
[facility] MD Progress Note  01/16/2016 4:44 PM Brett Coffey  MRN:  [id number] Subjective:  Patient states his mood is good today and he is not having any irritability or anger. Denies any homicidal ideation. Not having any auditory hallucinations today. Tolerating medication better. Doesn't feel oversedated. Principal Problem: Schizophrenia, undifferentiated (HCC) Diagnosis:   Patient Active Problem List   Diagnosis Date Noted  . Cannabis abuse [F12.10] 01/14/2016  . Schizophrenia, undifferentiated (HCC) [F20.3] 01/14/2016  . Schizophrenia (HCC) [F20.9] 05/12/2015  . Chronic back pain [M54.9, G89.29] 05/12/2015  . Mild persistent asthma in adult without complication [J45.30] 05/12/2015  . Cigarette smoker [F17.210] 05/12/2015   Total Time spent with patient: 20 minutes  Past Psychiatric History: Long-standing history of schizophrenia with a past history of psychotic violence. Currently compliant with outpatient medication.  Past Medical History:  Past Medical History:  Diagnosis Date  . Asthma   . Schizophrenia [facility])     Past Surgical History:  Procedure Laterality Date  . APPENDECTOMY    . RHINOPLASTY     Family History:  Family History  Problem Relation Age of Onset  . Diabetes Mother    Family Psychiatric  History: None Social History:  History  Alcohol Use  . 0.0 oz/week    Comment: "once a week"     History  Drug Use  . Types: Marijuana    Comment: "every day"    Social History   Social History  . Marital status: Single    Spouse name: N/A  . Number of children: N/A  . Years of education: N/A   Social History Main Topics  . Smoking status: Current Every Day Smoker    Packs/day: 2.00    Types: Cigarettes  . Smokeless tobacco: Never Used  . Alcohol use 0.0 oz/week     Comment: "once a week"  . Drug use:     Types: Marijuana     Comment: "every day"  . Sexual activity: Yes    Birth control/ protection: None   Other Topics Concern  . None    Social History Narrative  . None   Additional Social History:    Pain Medications: denies Prescriptions: see PTA meds Over the Counter: denies History of alcohol / drug use?: Yes                    Sleep: Fair  Appetite:  Fair  Current Medications: Current Facility-Administered Medications  Medication Dose Route Frequency Provider Last Rate Last Dose  . acetaminophen (TYLENOL) tablet 650 mg  650 mg Oral Q6H PRN Audery Amel, MD      . alum & mag hydroxide-simeth (MAALOX/MYLANTA) 200-200-20 MG/5ML suspension 30 mL  30 mL Oral Q4H PRN Audery Amel, MD      . citalopram (CELEXA) tablet 20 mg  20 mg Oral Daily Audery Amel, MD   20 mg at 01/16/16 4098  . hydrOXYzine (ATARAX/VISTARIL) tablet 25 mg  25 mg Oral TID PRN Audery Amel, MD      . lurasidone (LATUDA) tablet 60 mg  60 mg Oral Q supper Foye Deer, RPH   60 mg at 01/15/16 1710  . magnesium hydroxide (MILK OF MAGNESIA) suspension 30 mL  30 mL Oral Daily PRN Audery Amel, MD      . nicotine (NICODERM CQ - dosed in mg/24 hours) patch 21 mg  21 mg Transdermal Daily Jimmy Footman, MD   21 mg at 01/16/16 0820    Lab Results:  Results for orders placed or performed during the hospital encounter of 01/14/16 (from the past 48 hour(s))  Hemoglobin A1c     Status: None   Collection Time: 01/15/16  6:27 AM  Result Value Ref Range   Hgb A1c MFr Bld 5.2 4.8 - 5.6 %    Comment: (NOTE)         Pre-diabetes: 5.7 - 6.4         Diabetes: >6.4         Glycemic control for adults with diabetes: <7.0    Mean Plasma Glucose 103 mg/dL    Comment: (NOTE) Performed At: [facility] [address] [place], [place] [id number] Mila HomerHancock William F MD WU:[phone]   Lipid panel     Status: Abnormal   Collection Time: 01/15/16  6:27 AM  Result Value Ref Range   Cholesterol 166 0 - 200 mg/dL   Triglycerides 956160 (H) <150 mg/dL   HDL 36 (L) >21>40 mg/dL   Total CHOL/HDL Ratio 4.6 RATIO   VLDL 32 0 - 40 mg/dL    LDL Cholesterol 98 0 - 99 mg/dL    Comment:        Total Cholesterol/HDL:CHD Risk Coronary Heart Disease Risk Table                     Men   Women  1/2 Average Risk   3.4   3.3  Average Risk       5.0   4.4  2 X Average Risk   9.6   7.1  3 X Average Risk  23.4   11.0        Use the calculated Patient Ratio above and the CHD Risk Table to determine the patient's CHD Risk.        ATP III CLASSIFICATION (LDL):  <100     mg/dL   Optimal  [id number]  mg/dL   Near or Above                    Optimal  130-159  mg/dL   Borderline  [id number]  mg/dL   High  >952>190     mg/dL   Very High   TSH     Status: None   Collection Time: 01/15/16  6:27 AM  Result Value Ref Range   TSH 0.679 0.350 - 4.500 uIU/mL    Blood Alcohol level:  Lab Results  Component Value Date   ETH <5 01/13/2016    Metabolic Disorder Labs: Lab Results  Component Value Date   HGBA1C 5.2 01/15/2016   MPG 103 01/15/2016   No results found for: PROLACTIN Lab Results  Component Value Date   CHOL 166 01/15/2016   TRIG 160 (H) 01/15/2016   HDL 36 (L) 01/15/2016   CHOLHDL 4.6 01/15/2016   VLDL 32 01/15/2016   LDLCALC 98 01/15/2016    Physical Findings: AIMS: Facial and Oral Movements Muscles of Facial Expression: None, normal Lips and Perioral Area: None, normal Jaw: None, normal Tongue: None, normal,Extremity Movements Upper (arms, wrists, hands, fingers): None, normal Lower (legs, knees, ankles, toes): None, normal, Trunk Movements Neck, shoulders, hips: None, normal, Overall Severity Severity of abnormal movements (highest score from questions above): None, normal Incapacitation due to abnormal movements: None, normal Patient's awareness of abnormal movements (rate only patient's report): No Awareness, Dental Status Current problems with teeth and/or dentures?: No Does patient usually wear dentures?: No  CIWA:    COWS:     Musculoskeletal: Strength &  Muscle Tone: within normal limits Gait & Station:  normal Patient leans: N/A  Psychiatric Specialty Exam: Physical Exam  Constitutional: He appears well-developed and well-nourished.  HENT:  Head: Normocephalic and atraumatic.  Eyes: Conjunctivae are normal. Pupils are equal, round, and reactive to light.  Neck: Normal range of motion.  Cardiovascular: Normal heart sounds.   Respiratory: Effort normal.  GI: Soft.  Musculoskeletal: Normal range of motion.  Neurological: He is alert.  Skin: Skin is warm and dry.  Psychiatric: He has a normal mood and affect. His behavior is normal. Judgment and thought content normal.    Review of Systems  Constitutional: Negative.   HENT: Negative.   Eyes: Negative.   Respiratory: Negative.   Cardiovascular: Negative.   Gastrointestinal: Negative.   Musculoskeletal: Negative.   Skin: Negative.   Neurological: Negative.   Psychiatric/Behavioral: Negative.     Blood pressure 123/74, pulse 63, temperature 98.1 F (36.7 C), temperature source Oral, resp. rate 18, height 5\' 9"  (1.753 m), weight 98.4 kg (217 lb), SpO2 99 %.Body mass index is 32.05 kg/m.  General Appearance: Fairly Groomed  Eye Contact:  Good  Speech:  Clear and Coherent  Volume:  Normal  Mood:  Euthymic  Affect:  Congruent  Thought Process:  Coherent  Orientation:  Full (Time, Place, and Person)  Thought Content:  Logical  Suicidal Thoughts:  No  Homicidal Thoughts:  No  Memory:  Immediate;   Good Recent;   Fair Remote;   Fair  Judgement:  Fair  Insight:  Fair  Psychomotor Activity:  Normal  Concentration:  Concentration: Fair  Recall:  [employer] of Knowledge:  Fair  Language:  Fair  Akathisia:  No  Handed:  Right  AIMS (if indicated):     Assets:  Communication Skills Desire for Improvement Financial Resources/Insurance Housing Physical Health  ADL's:  Intact  Cognition:  WNL  Sleep:  Number of Hours: 7.25     Treatment Plan Summary: Daily contact with patient to assess and evaluate symptoms and  progress in treatment, Medication management and Plan Patient is schizophrenia and history of aggression. Symptoms improved today. Good insight. No hallucinations no thoughts of harming anyone. Affect brighter. Tolerating medicine. Feels that the 60 mg is much more tolerable than 80 mg. Supportive counseling and review of plan. Encouraged patient to discuss discharge planning with his treatment team tomorrow.  Mordecai Rasmussen, MD 01/16/2016, 4:44 PM

## 2016-01-16 NOTE — BHH Group Notes (Signed)
[facility] Group Notes:  (Nursing/MHT/Case Management/Adjunct)  Date:  01/16/2016  Time:  9:16 PM  Type of Therapy:  Evening Wrap-up Group  Participation Level:  Active  Participation Quality:  Appropriate and Attentive  Affect:  Appropriate  Cognitive:  Alert and Appropriate  Insight:  Appropriate, Good and Improving  Engagement in Group:  Developing/Improving and Engaged  Modes of Intervention:  Discussion  Summary of Progress/Problems:  Tomasita MorrowChelsea Nanta Reisha Wos 01/16/2016, 9:16 PM

## 2016-01-16 NOTE — B[facility] Group Notes (Signed)
B[facility] LCSW Group Therapy  01/16/2016 2:25 PM  Type of Therapy:  Group Therapy  Participation Level:  Minimal  Participation Quality:  Attentive  Affect:  Appropriate  Cognitive:  Alert  Insight:  Improving  Engagement in Therapy:  Engaged  Modes of Intervention:  Activity, Discussion, Education and Support  Summary of Progress/Problems:Safety Planning: Patients identified fears or worries surrounding discharge. Patients offered support to their peers and openly developed safety plans for their individual needs. Patients developed their own safety plan. Patients discussed their warning signs, coping strategies, support system with family and friends, identified mental health professionals, and how to keep their environments safe (ex. Removing unnecessary medications or removing weapons/guns). Patients then discussed their personalized safety plan with the group. Patient states he has been communicating with his mother and he identifies her as a support person to call when he needs help managing money and his medications.    Mace Weinberg G. Garnette CzechSampson MSW, LCSWA 01/16/2016, 2:43 PM

## 2016-01-16 NOTE — Plan of Care (Signed)
Problem: Health Behavior/Discharge Planning: Goal: Compliance with treatment plan for underlying cause of condition will improve Outcome: Progressing Pt is active in the milieu. He attends evening wrap up group.  Problem: Safety: Goal: Periods of time without injury will increase Outcome: Progressing Pt remains free from harm.

## 2016-01-16 NOTE — Progress Notes (Signed)
Pt has been pleasant and cooperative. Pt has attended all unit activities. Pt denies SI and A/V hallucinations. Pt's mood and affect has been a little less depressed.Pt has been more active on the unit.

## 2016-01-16 NOTE — Progress Notes (Signed)
D: Pt is seen in the milieu this evening interacting appropriately with staff and peers. He rates anxiety and depression 0/10. Denies SI/HI/AVH at this time. Pt reports that he would like to return to his current living situation. A: Emotional support and encouragement provided. q15 minute safety checks maintained. R: Pt remains free from harm. Will continue to monitor.

## 2016-01-17 ENCOUNTER — Encounter: Payer: Self-pay | Admitting: Psychiatry

## 2016-01-17 DIAGNOSIS — F17200 Nicotine dependence, unspecified, uncomplicated: Secondary | ICD-10-CM

## 2016-01-17 DIAGNOSIS — F1220 Cannabis dependence, uncomplicated: Secondary | ICD-10-CM

## 2016-01-17 DIAGNOSIS — J45909 Unspecified asthma, uncomplicated: Secondary | ICD-10-CM

## 2016-01-17 DIAGNOSIS — F1420 Cocaine dependence, uncomplicated: Secondary | ICD-10-CM

## 2016-01-17 LAB — PROLACTIN: PROLACTIN: 15.6 ng/mL — AB (ref 4.0–15.2)

## 2016-01-17 NOTE — Progress Notes (Signed)
D: Pt denies SI/HI/AVH. Pt is pleasant and cooperative. Pt appears less anxious and he is interacting with peers and staff appropriately.  A: Pt was offered support and encouragement. Pt was given scheduled medications. Pt was encouraged to attend groups. Q 15 minute checks were done for safety.  R:Pt attends groups and interacts well with peers and staff. Pt is taking medication. Pt has no complaints.Pt receptive to treatment and safety maintained on unit.   

## 2016-01-17 NOTE — B[facility] Group Notes (Signed)
[facility] LCSW Group Therapy   01/17/2016 1pm Type of Therapy: Group Therapy   Participation Level: Active   Participation Quality: Attentive, Sharing and Supportive   Affect: Depressed and Flat   Cognitive: Alert and Oriented   Insight: Developing/Improving and Engaged   Engagement in Therapy: Developing/Improving and Engaged   Modes of Intervention: Clarification, Confrontation, Discussion, Education, Exploration,  Limit-setting, Orientation, Problem-solving, Rapport Building, Dance movement psychotherapisteality Testing, Socialization and Support   Summary of Progress/Problems: Pt identified obstacles faced currently and processed barriers involved in overcoming these obstacles. Pt identified steps necessary for overcoming these obstacles and explored motivation (internal and external) for facing these difficulties head on. Pt further identified one area of concern in their lives and chose a goal to focus on for today.  Pt shared that the pt's primary goal on a daily basis is "transition into his own place".  Pt shared that the pt's primary obstacle to this goal is people who make him mad.  Pt shared the pt overcomes these obstacles by always walking despite family problems.  Pt shared the pt's primary coping mechanism is to "just walk away". Pt was polite and cooperative with the CSW and other group members and focused and attentive to the topics discussed and the sharing of others.   Dorothe PeaJonathan F. Khaleef Ruby, LCSWA, LCAS

## 2016-01-17 NOTE — Progress Notes (Signed)
[facility]  01/17/2016 12:33 PM Leo RodChristopher Greenstein  MRN:  [id number] Subjective:  35 year old man admitted to the hospital because of thoughts of hurting or killing his roommate which appeared to be somewhat based on paranoia. History of aggression in the past. Diagnosis of schizophrenia. Also chronic abuse of marijuana.  Patient has a long history of schizophrenia. Has been on antipsychotic medication and followed up as an outpatient. He says he has been compliant with his medicine. Positive past history of aggression and violence from delusions. No history of suicide attempt. Chronic substance abuse. Has outpatient treatment in place.  Urine toxicology was only positive for cannabis. His alcohol level was below the detection limit.  Today during assessment the patient tells me he wants medications to help him with crack cravings. He said he has been smoking large amounts of crack. He also tells me he has experimented with heroine but does not use often. Patient says he is here in the hospital because he was having homicidal ideations towards his roommate. He says that his roommate is now moving out of the house so he won't continue to have problems with him anymore. Patient says he follows up with Dr. Omelia [facility]waterHeaden [facility] he also has a therapist there who he sees twice a month.  Patient says he is not interested in intensive outpatient treatment for substance abuse because he dislikes group settings  Per nursing: D: Pt denies SI/HI/AVH. Pt is pleasant and cooperative. Pt  appears less anxious and he is interacting with peers and staff appropriately.  A: Pt was offered support and encouragement. Pt was given scheduled medications. Pt was encouraged to attend groups. Q 15 minute checks were done for safety.  R:Pt attends groups and interacts well with peers and staff. Pt is taking medication. Pt has no complaints.Pt receptive to treatment and safety maintained on  unit.    Principal Problem: Schizophrenia, undifferentiated (HCC) Diagnosis:   Patient Active Problem List   Diagnosis Date Noted  . Asthma [J45.909] 01/17/2016  . Cocaine use disorder, severe, dependence (HCC) [F14.20] 01/17/2016  . Tobacco use disorder [F17.200] 01/17/2016  . Cannabis use disorder, severe, dependence (HCC) [F12.20] 01/17/2016  . Schizophrenia, undifferentiated (HCC) [F20.3] 01/14/2016   Total Time spent with patient: 30 minutes  Past Psychiatric History:   Past Medical History:  Past Medical History:  Diagnosis Date  . Asthma   . Schizophrenia [facility](HCC)     Past Surgical History:  Procedure Laterality Date  . APPENDECTOMY    . RHINOPLASTY     Family History:  Family History  Problem Relation Age of Onset  . Diabetes Mother    Family Psychiatric  History:  Social History:  History  Alcohol Use  . 0.0 oz/week    Comment: "once a week"     History  Drug Use  . Types: Marijuana    Comment: "every day"    Social History   Social History  . Marital status: Single    Spouse name: N/A  . Number of children: N/A  . Years of education: N/A   Social History Main Topics  . Smoking status: Current Every Day Smoker    Packs/day: 2.00    Types: Cigarettes  . Smokeless tobacco: Never Used  . Alcohol use 0.0 oz/week     Comment: "once a week"  . Drug use:     Types: Marijuana     Comment: "every day"  . Sexual activity: Yes    Birth control/  protection: None   Other Topics Concern  . None   Social History Narrative  . None   Additional Social History:    Pain Medications: denies Prescriptions: see PTA meds Over the Counter: denies History of alcohol / drug use?: Yes      Current Medications: Current Facility-Administered Medications  Medication Dose Route Frequency Provider Last Rate Last Dose  . acetaminophen (TYLENOL) tablet 650 mg  650 mg Oral Q6H PRN Audery Amel, MD      . alum & mag hydroxide-simeth (MAALOX/MYLANTA)  200-200-20 MG/5ML suspension 30 mL  30 mL Oral Q4H PRN Audery Amel, MD      . citalopram (CELEXA) tablet 20 mg  20 mg Oral Daily Audery Amel, MD   20 mg at 01/17/16 1610  . hydrOXYzine (ATARAX/VISTARIL) tablet 25 mg  25 mg Oral TID PRN Audery Amel, MD      . lurasidone (LATUDA) tablet 60 mg  60 mg Oral Q supper Foye Deer, RPH   60 mg at 01/16/16 1704  . magnesium hydroxide (MILK OF MAGNESIA) suspension 30 mL  30 mL Oral Daily PRN Audery Amel, MD      . nicotine (NICODERM CQ - dosed in mg/24 hours) patch 21 mg  21 mg Transdermal Daily Jimmy Footman, MD   21 mg at 01/17/16 9604    Lab Results: No results found for this or any previous visit (from the past 48 hour(s)).  Blood Alcohol level:  Lab Results  Component Value Date   ETH <5 01/13/2016    Metabolic Disorder Labs: Lab Results  Component Value Date   HGBA1C 5.2 01/15/2016   MPG 103 01/15/2016   No results found for: PROLACTIN Lab Results  Component Value Date   CHOL 166 01/15/2016   TRIG 160 (H) 01/15/2016   HDL 36 (L) 01/15/2016   CHOLHDL 4.6 01/15/2016   VLDL 32 01/15/2016   LDLCALC 98 01/15/2016    Physical Findings: AIMS: Facial and Oral Movements Muscles of Facial Expression: None, normal Lips and Perioral Area: None, normal Jaw: None, normal Tongue: None, normal,Extremity Movements Upper (arms, wrists, hands, fingers): None, normal Lower (legs, knees, ankles, toes): None, normal, Trunk Movements Neck, shoulders, hips: None, normal, Overall Severity Severity of abnormal movements (highest score from questions above): None, normal Incapacitation due to abnormal movements: None, normal Patient's awareness of abnormal movements (rate only patient's report): No Awareness, Dental Status Current problems with teeth and/or dentures?: No Does patient usually wear dentures?: No  CIWA:    COWS:     Musculoskeletal: Strength & Muscle Tone: within normal limits Gait & Station:  normal Patient leans: N/A  Psychiatric Specialty Exam: Physical Exam  Constitutional: He is oriented to person, place, and time. He appears well-developed and well-nourished.  HENT:  Head: Normocephalic and atraumatic.  Eyes: Conjunctivae and EOM are normal.  Neck: Normal range of motion.  Respiratory: Effort normal.  Musculoskeletal: Normal range of motion.  Neurological: He is alert and oriented to person, place, and time.    Review of Systems  Constitutional: Negative.   HENT: Negative.   Eyes: Negative.   Respiratory: Negative.   Cardiovascular: Negative.   Gastrointestinal: Negative.   Genitourinary: Negative.   Musculoskeletal: Negative.   Skin: Negative.   Neurological: Negative.   Endo/Heme/Allergies: Negative.   Psychiatric/Behavioral: Positive for depression and substance abuse.    Blood pressure 121/82, pulse (!) 56, temperature 98.2 F (36.8 C), temperature source Oral, resp. rate 18, height 5\' 9"  (  1.753 m), weight 98.4 kg (217 lb), SpO2 99 %.Body mass index is 32.05 kg/m.  General Appearance: Fairly Groomed  Eye Contact:  Good  Speech:  Clear and Coherent  Volume:  Normal  Mood:  Dysphoric  Affect:  Constricted  Thought Process:  Linear and Descriptions of Associations: Intact  Orientation:  Full (Time, Place, and Person)  Thought Content:  Hallucinations: None  Suicidal Thoughts:  No  Homicidal Thoughts:  No  Memory:  Immediate;   Fair Recent;   Fair Remote;   Fair  Judgement:  Poor  Insight:  Shallow  Psychomotor Activity:  Decreased  Concentration:  Concentration: Fair and Attention Span: Fair  Recall:  [employer] of Knowledge:  Fair  Language:  Good  Akathisia:  No  Handed:    AIMS (if indicated):     Assets:  Financial Resources/Insurance Housing Physical Health  ADL's:  Intact  Cognition:  WNL  Sleep:  Number of Hours: 7.5     Treatment Plan Summary: Daily contact with patient to assess and evaluate symptoms and progress in treatment  and Medication management   Schizophrenia: Patient will be continued on Latuda 60 mg by mouth daily.    Depressive disorder continue Celexa 20 mg a day  Cannabis use disorder patient is not interested in substance abuse treatment  Cocaine use disorder. Patient complains of using crack heavily however his urine toxicology was negative. He showed no interest in substance abuse treatment  Tobacco use disorder patient will be started on nicotine patch 21 mg a day  Labs hemoglobin A1c and lipid panel have been checked. Hemoglobin A1c is 5.2  Disposition once stabilized the patient will be discharged back to his home  Follow-up the patient will continue to follow-up with his outpatient provider  Possible discharge in 24 hours  Jimmy Footman, MD 01/17/2016, 12:33 PM

## 2016-01-17 NOTE — Progress Notes (Signed)
D:  Patient denies SI/AVH/HI.  Patient has been appropriate with staff and peers.  Patient alert and oriented. A:  Patient administered scheduled medications.  Patient encouraged to attend groups. R:  Patient provided with a safe environment.  Patient safety maintained with 15 minute checks.

## 2016-01-17 NOTE — Plan of Care (Signed)
Problem: Coping: Goal: Ability to verbalize frustrations and anger appropriately will improve Outcome: Progressing Patient verbalized feeling...Marland Kitchen..Marland Kitchen
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
## 2016-01-17 NOTE — Progress Notes (Signed)
Recreation Therapy Notes  Date: 10.02.17 Time: 9:30 am Location: Craft Room  Group Topic: Self-expression  Goal Area(s) Addresses:  Patient will draw a bottle of how they see themselves. Patient will write at least one emotion they are experiencing.  Behavioral Response: Did not attend  Intervention: Bottled Up  Activity: Patients were instructed to draw a bottle of how they see themselves. Patients were instructed to write the emotions they were feeling on the inside of the bottle.  Education: LRT educated group on other forms of self-expression.  Education Outcome: Patient did not attend group.  Clinical Observations/Feedback: Patient did not attend group.  Jacquelynn CreeGreene,Antonae Zbikowski M, LRT/CTRS 01/17/2016 10:13 AM

## 2016-01-17 NOTE — [facility] Suicide Risk Assessment (Addendum)
[facility]   Principal Problem: Schizophrenia, undifferentiated (HCC) Discharge Diagnoses:  Patient Active Problem List   Diagnosis Date Noted  . Asthma [J45.909] 01/17/2016  . Cocaine use disorder, severe, dependence (HCC) [F14.20] 01/17/2016  . Tobacco use disorder [F17.200] 01/17/2016  . Cannabis use disorder, severe, dependence (HCC) [F12.20] 01/17/2016  . Schizophrenia, undifferentiated (HCC) [F20.3] 01/14/2016     Psychiatric Specialty Exam: ROS  Blood pressure 128/81, pulse 69, temperature 98 F (36.7 C), temperature source Oral, resp. rate 18, height 5\' 9"  (1.753 m), weight 98.4 kg (217 lb), SpO2 99 %.Body mass index is 32.05 kg/m.                                                       Mental Status Per Nursing Assessment::   On Admission:  Thoughts of violence towards others  Demographic Factors:  Male and Caucasian  Loss Factors: relational problems with roomate  Historical Factors: Impulsivity  Risk Reduction Factors:   Positive social support  Continued Clinical Symptoms:  Alcohol/Substance Abuse/Dependencies Schizophrenia:   Less than 33 years old Previous Psychiatric Diagnoses and Treatments  Cognitive Features That Contribute To Risk:  Closed-mindedness    Suicide Risk:  Minimal: No identifiable suicidal ideation.  Patients presenting with no risk factors but with morbid ruminations; may be classified as minimal risk based on the severity of the depressive symptoms    Jimmy FootmanHernandez-Gonzalez,  Kratos Ruscitti, MD 01/18/2016, 8:14 AM

## 2016-01-18 NOTE — Progress Notes (Addendum)
D: Pt is pleasant and cooperative this evening. He reports difficulty sleeping and requests PRN medication. Pt denies SI/HI/AVH at this time. He rates anxiety and depression 0/10. When asked how he will manage his anger in the future, pt laughs and states "come back to the psych ward. I like it here." A: Emotional support and encouragement provided. Medications administered with education. Pt educated on positive coping skills. q15 minute safety checks maintained. R: Pt remains free from harm. Will continue to monitor.

## 2016-01-18 NOTE — Progress Notes (Signed)
Recreation Therapy Notes  Date: 10.03.17 Time: 9:30 am Location: Craft Room  Group Topic: Goal Setting  Goal Area(s) Addresses:  Patient will write at least one goal. Patient will write at least one obstacle.  Behavioral Response: Attentive, Interactive, Left early  Intervention: Recovery Goal Chart  Activity: Patients were instructed to make a Recovery Goal Chart including their goals, obstacles, the date they started working on their goals, and the date they achieved their goals.  Education: LRT educated patients on healthy ways to celebrate reaching their goals.  Education Outcome: Acknowledges education  Clinical Observations/Feedback: Patient completed activity by writing goals and obstacles. Patient contributed to group discussion by stating how he can keep himself focused on his goals, how he can overcome his obstacles, and a healthy way for him to celebrate reaching his goals.  Jacquelynn CreeGreene,Gulianna Hornsby M, LRT/CTRS 01/18/2016 11:57 AM

## 2016-01-18 NOTE — [facility] Group Notes (Signed)
[facility] Group Notes:  (Nursing/MHT/Case Management/Adjunct)  Date:  01/18/2016  Time:  3:49 AM  Type of Therapy:  Group Therapy  Participation Level:  Did Not Attend    Brett Coffey 01/18/2016, 3:49 AM

## 2016-01-18 NOTE — Plan of Care (Signed)
Problem: Activity: Goal: Interest or engagement in activities will improve Outcome: Progressing Actively participating in plan of care.

## 2016-01-18 NOTE — Progress Notes (Addendum)
Patient with appropriate affect, cooperative behavior with meals, meds and plan of care. No SI/HI at this time. Therapy groups encouraged. Patient goal today is to work on discharge plan. Safety maintained. Patient to discharge when discharge plan and transportation in place.

## 2016-01-18 NOTE — Plan of Care (Signed)
Problem: Safety: Goal: Periods of time without injury will increase Outcome: Progressing Pt remains free from harm.  Problem: Health Behavior/Discharge Planning: Goal: Ability to implement measures to prevent violent behavior in the future will improve Outcome: Not Progressing When asked what he will do if angry in the future, pt states "come back to the psych ward."

## 2016-01-18 NOTE — Progress Notes (Signed)
Discharged at this time. Acknowledges all belongings have been returned. Verbalizes understanding rt recommended discharge plan of care.

## 2016-01-18 NOTE — [facility] Group Notes (Signed)
Goals Group Date/Time: 01/18/2016 9:00 AM Type of Therapy and Topic: Group Therapy: Goals Group: SMART Goals   Participation Level: Moderate  Description of Group:    The purpose of a daily goals group is to assist and guide patients in setting recovery/wellness-related goals. The objective is to set goals as they relate to the crisis in which they were admitted. Patients will be using SMART goal modalities to set measurable goals. Characteristics of realistic goals will be discussed and patients will be assisted in setting and processing how one will reach their goal. Facilitator will also assist patients in applying interventions and coping skills learned in psycho-education groups to the SMART goal and process how one will achieve defined goal.   Therapeutic Goals:   -Patients will develop and document one goal related to or their crisis in which brought them into treatment.  -Patients will be guided by LCSW using SMART goal setting modality in how to set a measurable, attainable, realistic and time sensitive goal.  -Patients will process barriers in reaching goal.  -Patients will process interventions in how to overcome and successful in reaching goal.   Patient's Goal: Pt shared that the pt's goal is to maintain a place of residence.  Pt shared that the pt intends to make this time-sensitive as evidenced by the pt learning coping skills that will assist the pt in "not being kicked out".  Pt intends to complete the pt's goal with the help of a Child psychotherapistsocial worker.     Therapeutic Modalities:  Motivational Interviewing  Research officer, political partyCognitive Behavioral Therapy  Crisis Intervention Model  SMART goals setting   Dorothe PeaJonathan F. Quantisha Marsicano, LCSWA, LCAS

## 2016-01-18 NOTE — Progress Notes (Signed)
  [facility]  Will you be returning to the same living situation after discharge:  Yes,    At discharge, do you have transportation home?: Yes,   Link Bus ticket provided Do you have the ability to pay for your medications: Yes,     Release of information consent forms completed and in the chart;  Patient's signature needed at discharge.  Patient to Follow up at: Follow-up Information    [facility]. Go on 01/25/2016.   Why:  1:00pm, for hospital follow up with therapist. Contact information: [address] [place], [place]  [address] [phone] FAX [phone]        [facility]. Go on 02/03/2016.   Why:  12:30pm, Hospital Follow up for Medication Management with Dr. Elvera BickerHeaden Contact information: [address] [place], [place]  [address] 531 [phone] Valinda HoarFAX [phone]           Next level of care provider [facility] Link:no  Safety Planning and Suicide Prevention discussed: Yes,     Have you used any form of tobacco in the last 30 days? (Cigarettes, Smokeless Tobacco, Cigars, and/or Pipes): Yes  Has patient been referred to the Quitline?: Patient refused referral  Patient has been referred for addiction treatment: Yes  Glennon MacSara P Mikell Kazlauskas, MSW, LCSW 01/18/2016, 4:21 PM

## 2016-08-04 ENCOUNTER — Emergency Department: Payer: Medicare Other

## 2016-08-04 ENCOUNTER — Encounter: Payer: Self-pay | Admitting: Emergency Medicine

## 2016-08-04 ENCOUNTER — Emergency Department
Admission: EM | Admit: 2016-08-04 | Discharge: 2016-08-04 | Disposition: A | Discharge status: Home / Self Care | Payer: Medicare Other | Attending: Emergency Medicine | Admitting: Emergency Medicine

## 2016-08-04 DIAGNOSIS — F17210 Nicotine dependence, cigarettes, uncomplicated: Secondary | ICD-10-CM | POA: Insufficient documentation

## 2016-08-04 DIAGNOSIS — Y998 Other external cause status: Secondary | ICD-10-CM | POA: Insufficient documentation

## 2016-08-04 DIAGNOSIS — Y929 Unspecified place or not applicable: Secondary | ICD-10-CM | POA: Diagnosis not present

## 2016-08-04 DIAGNOSIS — J45909 Unspecified asthma, uncomplicated: Secondary | ICD-10-CM | POA: Diagnosis not present

## 2016-08-04 DIAGNOSIS — Y9389 Activity, other specified: Secondary | ICD-10-CM | POA: Diagnosis not present

## 2016-08-04 DIAGNOSIS — S63091A Other subluxation of right wrist and hand, initial encounter: Secondary | ICD-10-CM | POA: Diagnosis not present

## 2016-08-04 DIAGNOSIS — S6991XA Unspecified injury of right wrist, hand and finger(s), initial encounter: Secondary | ICD-10-CM | POA: Diagnosis present

## 2016-08-04 NOTE — Discharge Instructions (Signed)
Wear splint until evaluation by Ortho surgeon. Call to schedule appointment. May removed for showering/bath.
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
## 2016-10-13 ENCOUNTER — Other Ambulatory Visit: Payer: Self-pay | Admitting: Specialist

## 2016-10-13 DIAGNOSIS — S63391A Traumatic rupture of other ligament of right wrist, initial encounter: Secondary | ICD-10-CM

## 2016-10-19 ENCOUNTER — Ambulatory Visit: Payer: Medicare Other | Attending: Specialist

## 2016-10-26 ENCOUNTER — Ambulatory Visit: Admission: RE | Admit: 2016-10-26 | Payer: Medicare Other | Source: Ambulatory Visit
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
